# Patient Record
Sex: Female | Born: 1975 | Race: Asian | Hispanic: No | Marital: Married | Smoking: Never smoker
Health system: Southern US, Community
[De-identification: ages and names within clinical notes are randomized; demographics above are authoritative.]

## PROBLEM LIST (undated history)

## (undated) DIAGNOSIS — Z789 Other specified health status: Secondary | ICD-10-CM

## (undated) DIAGNOSIS — O9989 Other specified diseases and conditions complicating pregnancy, childbirth and the puerperium: Secondary | ICD-10-CM

## (undated) DIAGNOSIS — Z283 Underimmunization status: Secondary | ICD-10-CM

## (undated) HISTORY — PX: NO PAST SURGERIES: SHX2092

---

## 2009-02-19 ENCOUNTER — Inpatient Hospital Stay (HOSPITAL_COMMUNITY): Admission: AD | Admit: 2009-02-19 | Discharge: 2009-02-21 | Payer: Self-pay | Admitting: Obstetrics & Gynecology

## 2009-02-24 ENCOUNTER — Encounter: Admission: RE | Admit: 2009-02-24 | Discharge: 2009-03-24 | Payer: Self-pay | Admitting: Obstetrics & Gynecology

## 2009-03-07 ENCOUNTER — Ambulatory Visit: Admission: RE | Admit: 2009-03-07 | Discharge: 2009-03-07 | Payer: Self-pay | Admitting: Obstetrics & Gynecology

## 2011-02-10 LAB — CBC
HCT: 39.1 % (ref 36.0–46.0)
MCHC: 35.1 g/dL (ref 30.0–36.0)
MCHC: 35.8 g/dL (ref 30.0–36.0)
MCV: 106.9 fL — ABNORMAL HIGH (ref 78.0–100.0)
MCV: 107.6 fL — ABNORMAL HIGH (ref 78.0–100.0)
Platelets: 130 10*3/uL — ABNORMAL LOW (ref 150–400)
Platelets: 150 10*3/uL (ref 150–400)
RDW: 13.6 % (ref 11.5–15.5)
RDW: 13.8 % (ref 11.5–15.5)

## 2011-11-02 NOTE — L&D Delivery Note (Addendum)
Delivery Note  Called for precipitous delivery and arrived as head crowning. At 6:11 PM a viable female was delivered OA -> ROA. Cord clamped and cut, cord blood obtained; Dr. Seymour Bars arrived and assumed care at 6:12 pm. POE,DEIRDRE 09/26/2012, 6:16 PM   APGAR: 9,9 ; weight pending.   Placenta status: complete, 3 vs.  Cord:  With no complications: .  Cord pH:  Not done  Anesthesia: none   Episiotomy: none Lacerations: 1st degree perineal and left periurethral.  (Left ant. Labia separated from previous delivery) Suture Repair:  Vycril rapide 3-0. Est. Blood Loss (mL): 300  Mom to postpartum.  Baby to nursery-stable.

## 2012-02-25 LAB — OB RESULTS CONSOLE ABO/RH: RH Type: POSITIVE

## 2012-02-25 LAB — OB RESULTS CONSOLE ANTIBODY SCREEN: Antibody Screen: NEGATIVE

## 2012-03-06 LAB — OB RESULTS CONSOLE GC/CHLAMYDIA
Chlamydia: NEGATIVE
Gonorrhea: NEGATIVE

## 2012-09-26 ENCOUNTER — Encounter (HOSPITAL_COMMUNITY): Payer: Self-pay | Admitting: *Deleted

## 2012-09-26 ENCOUNTER — Inpatient Hospital Stay (HOSPITAL_COMMUNITY)
Admission: AD | Admit: 2012-09-26 | Discharge: 2012-09-28 | DRG: 373 | Disposition: A | Discharge status: Home / Self Care | Payer: BC Managed Care – PPO | Source: Ambulatory Visit | Attending: Obstetrics & Gynecology | Admitting: Obstetrics & Gynecology

## 2012-09-26 DIAGNOSIS — Z283 Underimmunization status: Secondary | ICD-10-CM

## 2012-09-26 DIAGNOSIS — O09519 Supervision of elderly primigravida, unspecified trimester: Secondary | ICD-10-CM | POA: Diagnosis present

## 2012-09-26 DIAGNOSIS — Z2839 Other underimmunization status: Secondary | ICD-10-CM

## 2012-09-26 HISTORY — DX: Other specified health status: Z78.9

## 2012-09-26 HISTORY — DX: Other specified diseases and conditions complicating pregnancy, childbirth and the puerperium: O99.89

## 2012-09-26 HISTORY — DX: Underimmunization status: Z28.3

## 2012-09-26 LAB — CBC
Platelets: 149 10*3/uL — ABNORMAL LOW (ref 150–400)
RBC: 3.85 MIL/uL — ABNORMAL LOW (ref 3.87–5.11)
RDW: 13.4 % (ref 11.5–15.5)
WBC: 14.4 10*3/uL — ABNORMAL HIGH (ref 4.0–10.5)

## 2012-09-26 MED ORDER — OXYTOCIN 10 UNIT/ML IJ SOLN
INTRAMUSCULAR | Status: AC
  Administered 2012-09-26: 10 [IU] via INTRAMUSCULAR
  Filled 2012-09-26: qty 2

## 2012-09-26 MED ORDER — LIDOCAINE HCL (PF) 1 % IJ SOLN
INTRAMUSCULAR | Status: AC
  Administered 2012-09-26: 30 mL via SUBCUTANEOUS
  Filled 2012-09-26: qty 30

## 2012-09-26 MED ORDER — FLEET ENEMA 7-19 GM/118ML RE ENEM: 1.0000 | ENEMA | RECTAL | Status: DC | PRN

## 2012-09-26 MED ORDER — SENNOSIDES-DOCUSATE SODIUM 8.6-50 MG PO TABS
2.0000 | ORAL_TABLET | Freq: Every day | ORAL | Status: DC
  Administered 2012-09-27: 2 via ORAL

## 2012-09-26 MED ORDER — OXYTOCIN 40 UNITS IN LACTATED RINGERS INFUSION - SIMPLE MED: 62.5000 mL/h | INTRAVENOUS | Status: DC

## 2012-09-26 MED ORDER — OXYTOCIN BOLUS FROM INFUSION: 500.0000 mL | INTRAVENOUS | Status: DC

## 2012-09-26 MED ORDER — DIBUCAINE 1 % RE OINT: 1.0000 "application " | TOPICAL_OINTMENT | RECTAL | Status: DC | PRN

## 2012-09-26 MED ORDER — SIMETHICONE 80 MG PO CHEW: 80.0000 mg | CHEWABLE_TABLET | ORAL | Status: DC | PRN

## 2012-09-26 MED ORDER — IBUPROFEN 600 MG PO TABS
600.0000 mg | ORAL_TABLET | Freq: Four times a day (QID) | ORAL | Status: DC | PRN
  Administered 2012-09-26: 600 mg via ORAL
  Filled 2012-09-26: qty 1

## 2012-09-26 MED ORDER — ONDANSETRON HCL 4 MG/2ML IJ SOLN: 4.0000 mg | INTRAMUSCULAR | Status: DC | PRN

## 2012-09-26 MED ORDER — LACTATED RINGERS IV SOLN: INTRAVENOUS | Status: DC

## 2012-09-26 MED ORDER — BENZOCAINE-MENTHOL 20-0.5 % EX AERO: 1.0000 "application " | INHALATION_SPRAY | CUTANEOUS | Status: DC | PRN

## 2012-09-26 MED ORDER — LANOLIN HYDROUS EX OINT: TOPICAL_OINTMENT | CUTANEOUS | Status: DC | PRN

## 2012-09-26 MED ORDER — WITCH HAZEL-GLYCERIN EX PADS: 1.0000 "application " | MEDICATED_PAD | CUTANEOUS | Status: DC | PRN

## 2012-09-26 MED ORDER — DIPHENHYDRAMINE HCL 25 MG PO CAPS: 25.0000 mg | ORAL_CAPSULE | Freq: Four times a day (QID) | ORAL | Status: DC | PRN

## 2012-09-26 MED ORDER — TETANUS-DIPHTH-ACELL PERTUSSIS 5-2.5-18.5 LF-MCG/0.5 IM SUSP: 0.5000 mL | Freq: Once | INTRAMUSCULAR | Status: DC

## 2012-09-26 MED ORDER — CITRIC ACID-SODIUM CITRATE 334-500 MG/5ML PO SOLN: 30.0000 mL | ORAL | Status: DC | PRN

## 2012-09-26 MED ORDER — ONDANSETRON HCL 4 MG/2ML IJ SOLN: 4.0000 mg | Freq: Four times a day (QID) | INTRAMUSCULAR | Status: DC | PRN

## 2012-09-26 MED ORDER — ZOLPIDEM TARTRATE 5 MG PO TABS: 5.0000 mg | ORAL_TABLET | Freq: Every evening | ORAL | Status: DC | PRN

## 2012-09-26 MED ORDER — ACETAMINOPHEN 325 MG PO TABS: 650.0000 mg | ORAL_TABLET | ORAL | Status: DC | PRN

## 2012-09-26 MED ORDER — ONDANSETRON HCL 4 MG PO TABS: 4.0000 mg | ORAL_TABLET | ORAL | Status: DC | PRN

## 2012-09-26 MED ORDER — OXYCODONE-ACETAMINOPHEN 5-325 MG PO TABS
1.0000 | ORAL_TABLET | ORAL | Status: DC | PRN
  Administered 2012-09-26 – 2012-09-27 (×3): 1 via ORAL
  Filled 2012-09-26 (×3): qty 1

## 2012-09-26 MED ORDER — IBUPROFEN 600 MG PO TABS
600.0000 mg | ORAL_TABLET | Freq: Four times a day (QID) | ORAL | Status: DC
  Administered 2012-09-27 – 2012-09-28 (×6): 600 mg via ORAL
  Filled 2012-09-26 (×7): qty 1

## 2012-09-26 MED ORDER — PRENATAL MULTIVITAMIN CH
1.0000 | ORAL_TABLET | Freq: Every day | ORAL | Status: DC
  Administered 2012-09-27 – 2012-09-28 (×2): 1 via ORAL
  Filled 2012-09-26 (×2): qty 1

## 2012-09-26 MED ORDER — OXYCODONE-ACETAMINOPHEN 5-325 MG PO TABS
1.0000 | ORAL_TABLET | ORAL | Status: DC | PRN
Start: 2012-09-26 — End: 2012-09-26

## 2012-09-26 MED ORDER — LIDOCAINE HCL (PF) 1 % IJ SOLN
30.0000 mL | INTRAMUSCULAR | Status: DC | PRN
  Administered 2012-09-26: 30 mL via SUBCUTANEOUS

## 2012-09-26 MED ORDER — LACTATED RINGERS IV SOLN: 500.0000 mL | INTRAVENOUS | Status: DC | PRN

## 2012-09-26 NOTE — MAU Note (Signed)
Contractions since last night. Stronger this afternoon. Leaked some water last night.

## 2012-09-26 NOTE — Progress Notes (Signed)
Report called to Synetta Fail RN in [facility] by [name] RNC. Pt to 169 via stretcher

## 2012-09-26 NOTE — MAU Note (Signed)
[name] RNC notified Dr Seymour Bars of pt's admission and status. MD requested to come in for delivery and aware pt transported to [facility]

## 2012-09-26 NOTE — H&P (Signed)
Brandy Whitney is a 36 y.o. female G1P1001 [redacted]w[redacted]d presenting for advanced labor.  RA:  Arrived at MAU at full dilation.  OB History    Grav Para Term Preterm Abortions TAB SAB Ect Mult Living   1 1 1  0 0 0 0 0 0 1     Past Medical History  Diagnosis Date  . No pertinent past medical history    Past Surgical History  Procedure Date  . No past surgeries    Family History: family history is negative for Other. Social History:  reports that she has never smoked. She does not have any smokeless tobacco history on file. She reports that she does not drink alcohol or use illicit drugs. Current facility-administered medications:[COMPLETED] oxytocin (PITOCIN) 10 UNIT/ML injection, , , , , 10 Units at 09/26/12 1822;  acetaminophen (TYLENOL) tablet 650 mg, 650 mg, Oral, Q4H PRN, Genia Del, MD;  citric acid-sodium citrate (ORACIT) solution 30 mL, 30 mL, Oral, Q2H PRN, Genia Del, MD;  ibuprofen (ADVIL,MOTRIN) tablet 600 mg, 600 mg, Oral, Q6H PRN, Genia Del, MD lactated ringers infusion 500-1,000 mL, 500-1,000 mL, Intravenous, PRN, Genia Del, MD;  lactated ringers infusion, , Intravenous, Continuous, Genia Del, MD;  lidocaine (XYLOCAINE) 1 % injection 30 mL, 30 mL, Subcutaneous, PRN, Genia Del, MD, 30 mL at 09/26/12 1823;  ondansetron (ZOFRAN) injection 4 mg, 4 mg, Intravenous, Q6H PRN, Genia Del, MD oxyCODONE-acetaminophen (PERCOCET/ROXICET) 5-325 MG per tablet 1-2 tablet, 1-2 tablet, Oral, Q3H PRN, Genia Del, MD;  oxytocin (PITOCIN) IV BOLUS FROM BAG, 500 mL, Intravenous, Continuous, Genia Del, MD;  oxytocin (PITOCIN) IV infusion 40 units in LR 1000 mL, 62.5 mL/hr, Intravenous, Continuous, Genia Del, MD;  sodium phosphate (FLEET) 7-19 GM/118ML enema 1 enema, 1 enema, Rectal, PRN, Genia Del, MD Allergies not on file  Dilation: 10 Station: -1 Exam by:: Quintella Baton RNC BOWB Blood pressure 76/56, pulse 99, temperature 97.6 F  (36.4 C), temperature source Oral, resp. rate 22, unknown if currently breastfeeding.  HPP: There is no problem list on file for this patient.   Prenatal labs: ABO, Rh: O/Positive/-- (04/26 0000) Antibody: Negative (04/26 0000) Rubella:   RPR: Nonreactive (04/26 0000)  HBsAg: Negative (04/26 0000)  HIV: Non-reactive (04/26 0000)  Genetic testing: wnl [place] anato: wnl 1 hr GTT: wnl GBS: Negative (11/01 0000)   Assessment/Plan: Advanced labor, expeditive delivery.  Fetal well-being reassuring.   Brandy Whitney,MARIE-LYNE 09/26/2012, 6:42 PM

## 2012-09-27 LAB — CBC
HCT: 35.7 % — ABNORMAL LOW (ref 36.0–46.0)
MCH: 36 pg — ABNORMAL HIGH (ref 26.0–34.0)
MCV: 102 fL — ABNORMAL HIGH (ref 78.0–100.0)
Platelets: 129 10*3/uL — ABNORMAL LOW (ref 150–400)
RBC: 3.5 MIL/uL — ABNORMAL LOW (ref 3.87–5.11)
RDW: 13.3 % (ref 11.5–15.5)

## 2012-09-27 LAB — RPR: RPR Ser Ql: NONREACTIVE

## 2012-09-27 NOTE — Progress Notes (Signed)
Patient ID: Brandy Whitney, female   DOB: [birth year], 36 y.o.   MRN: [id number] PPD # 1  Subjective: Pt reports feeling well, tired, mild soreness/ Pain controlled with ibuprofen Tolerating po/ Voiding without problems/ No n/v Bleeding is moderate Newborn info:  Information for the patient's newborn:  Brandy, Whitney [[id number]]  female  Feeding: breast   Objective:  VS: Blood pressure 98/63, pulse 65, temperature 97.8 F (36.6 C), temperature source Oral, resp. rate 18.    Basename 09/27/12 0535 09/26/12 1903  WBC 12.4* 14.4*  HGB 12.6 14.0  HCT 35.7* 39.0  PLT 129* 149*    Blood type: --/--/O POS, O POS (11/26 1903) Rubella: Nonimmune (04/26 0000)    Physical Exam:  General: A & O x 3  alert, cooperative and no distress CV: Regular rate and rhythm Resp: clear Abdomen: soft, nontender, normal bowel sounds Uterine Fundus: firm, below umbilicus, nontender Perineum: minor 1st degree repair noted, and healing well; small amt edema Lochia: moderate Ext: edema trace   A/P: PPD # 1/ G1P1001/ S/P:spontaneous vaginal delivery with 1st degree repair Doing well Continue routine post partum orders Anticipate D/C home in AM    Demetrius Revel, MSN, [facility] 09/27/2012, 8:51 AM

## 2012-09-28 ENCOUNTER — Encounter (HOSPITAL_COMMUNITY): Payer: Self-pay

## 2012-09-28 DIAGNOSIS — Z2839 Other underimmunization status: Secondary | ICD-10-CM

## 2012-09-28 DIAGNOSIS — Z283 Underimmunization status: Secondary | ICD-10-CM

## 2012-09-28 DIAGNOSIS — O99892 Other specified diseases and conditions complicating childbirth: Secondary | ICD-10-CM

## 2012-09-28 HISTORY — DX: Other specified diseases and conditions complicating childbirth: O99.892

## 2012-09-28 HISTORY — DX: Other underimmunization status: Z28.39

## 2012-09-28 MED ORDER — IBUPROFEN 600 MG PO TABS: 600.0000 mg | ORAL_TABLET | Freq: Four times a day (QID) | ORAL | Status: AC

## 2012-09-28 MED ORDER — PRENATAL MULTIVITAMIN CH: 1.0000 | ORAL_TABLET | Freq: Every day | ORAL | Status: AC

## 2012-09-28 MED ORDER — MEASLES, MUMPS & RUBELLA VAC ~~LOC~~ INJ
0.5000 mL | INJECTION | Freq: Once | SUBCUTANEOUS | Status: AC
  Administered 2012-09-28: 0.5 mL via SUBCUTANEOUS
  Filled 2012-09-28: qty 0.5

## 2012-09-28 NOTE — Progress Notes (Addendum)
Post Partum Day #2            Information for the patient's newborn:  Adriauna, Campton [[id number]]  female   Feeding: breast  Subjective: No HA, SOB, CP, F/C, breast symptoms. Pain minimal. Normal vaginal bleeding, no clots.      Objective:  Temp:  [98.1 F (36.7 C)-98.6 F (37 C)] 98.1 F (36.7 C) (11/28 0559) Pulse Rate:  [60-72] 60  (11/28 0559) Resp:  [16-18] 18  (11/28 0559) BP: (88-115)/(49-71) 99/64 mmHg (11/28 0559)  No intake or output data in the 24 hours ending 09/28/12 0821     Basename 09/27/12 0535 09/26/12 1903  WBC 12.4* 14.4*  HGB 12.6 14.0  HCT 35.7* 39.0  PLT 129* 149*    Blood type: --/--/O POS, O POS (11/26 1903) Rubella: Nonimmune (04/26 0000)    Physical Exam:  General: alert, cooperative and no distress Uterine Fundus: firm Lochia: appropriate Perineum: repair intact, edema none DVT Evaluation: Negative Homan's sign. No significant calf/ankle edema.    Assessment/Plan: PPD # 2 / 36 y.o., [id number] S/P:spontaneous vaginal   Active Problems:  NSVD (normal spontaneous vaginal delivery 11/26)  Postpartum care following vaginal delivery  First-degree perineal laceration, with delivery  Rubella nonimmune status, delivered, current hospitalization    normal postpartum exam  Continue current postpartum care  MMR vaccine prior to DC  D/C home   LOS: 2 days   PAUL,DANIELA, CNM, MSN 09/28/2012, 8:21 AM

## 2012-09-28 NOTE — Discharge Summary (Signed)
Obstetric Discharge Summary Reason for Admission: onset of labor Prenatal Procedures: ultrasound Intrapartum Procedures: spontaneous vaginal delivery and precipitous labor Postpartum Procedures: Rubella Ig Complications-Operative and Postpartum: 1st degree perineal laceration Hemoglobin  Date Value Range Status  09/27/2012 12.6  12.0 - 15.0 g/dL Final     HCT  Date Value Range Status  09/27/2012 35.7* 36.0 - 46.0 % Final    Physical Exam:  General: alert, cooperative and no distress Lochia: appropriate Uterine Fundus: firm Incision: healing well DVT Evaluation: Negative Homan's sign. No significant calf/ankle edema.  Discharge Diagnoses: Term Pregnancy-delivered  Discharge Information: Date: 09/28/2012 Activity: pelvic rest Diet: routine Medications: PNV and Ibuprofen Condition: stable Instructions: refer to practice specific booklet Discharge to: home Follow-up Information    Follow up with LAVOIE,MARIE-LYNE, MD. In 6 weeks.   Contact information:   Brandy Whitney [id number] [phone]          Newborn Data: Live born female  Birth Weight: 6 lb 4 oz (2835 g) APGAR: 9, 9  Home with mother.  Brandy Whitney 09/28/2012, 8:39 AM

## 2014-07-11 ENCOUNTER — Other Ambulatory Visit (HOSPITAL_COMMUNITY): Payer: Self-pay | Admitting: Family Medicine

## 2014-07-11 ENCOUNTER — Other Ambulatory Visit: Payer: Self-pay | Admitting: Family Medicine

## 2014-07-11 DIAGNOSIS — R1110 Vomiting, unspecified: Secondary | ICD-10-CM

## 2014-07-11 DIAGNOSIS — R634 Abnormal weight loss: Secondary | ICD-10-CM

## 2014-07-15 ENCOUNTER — Ambulatory Visit (HOSPITAL_COMMUNITY)
Admission: RE | Admit: 2014-07-15 | Discharge: 2014-07-15 | Disposition: A | Discharge status: Home / Self Care | Payer: BC Managed Care – PPO | Source: Ambulatory Visit | Attending: Family Medicine | Admitting: Family Medicine

## 2014-07-15 DIAGNOSIS — R112 Nausea with vomiting, unspecified: Secondary | ICD-10-CM | POA: Diagnosis not present

## 2014-07-15 DIAGNOSIS — R1110 Vomiting, unspecified: Secondary | ICD-10-CM

## 2014-07-15 DIAGNOSIS — R634 Abnormal weight loss: Secondary | ICD-10-CM

## 2014-07-15 DIAGNOSIS — R17 Unspecified jaundice: Secondary | ICD-10-CM | POA: Insufficient documentation

## 2014-09-02 ENCOUNTER — Encounter (HOSPITAL_COMMUNITY): Payer: Self-pay

## 2014-10-18 ENCOUNTER — Emergency Department (HOSPITAL_COMMUNITY)
Admission: EM | Admit: 2014-10-18 | Discharge: 2014-10-18 | Disposition: A | Discharge status: Home / Self Care | Payer: BC Managed Care – PPO | Attending: Emergency Medicine | Admitting: Emergency Medicine

## 2014-10-18 ENCOUNTER — Encounter (HOSPITAL_COMMUNITY): Payer: Self-pay | Admitting: Emergency Medicine

## 2014-10-18 DIAGNOSIS — R202 Paresthesia of skin: Secondary | ICD-10-CM | POA: Diagnosis not present

## 2014-10-18 DIAGNOSIS — R200 Anesthesia of skin: Secondary | ICD-10-CM | POA: Diagnosis not present

## 2014-10-18 DIAGNOSIS — Z79899 Other long term (current) drug therapy: Secondary | ICD-10-CM | POA: Diagnosis not present

## 2014-10-18 LAB — I-STAT CHEM 8, ED
BUN: 12 mg/dL (ref 6–23)
Calcium, Ion: 1.19 mmol/L (ref 1.12–1.23)
Chloride: 101 mEq/L (ref 96–112)
Creatinine, Ser: 0.5 mg/dL (ref 0.50–1.10)
Glucose, Bld: 129 mg/dL — ABNORMAL HIGH (ref 70–99)
HCT: 43 % (ref 36.0–46.0)
HEMOGLOBIN: 14.6 g/dL (ref 12.0–15.0)
POTASSIUM: 3.9 meq/L (ref 3.7–5.3)
SODIUM: 139 meq/L (ref 137–147)
TCO2: 25 mmol/L (ref 0–100)

## 2014-10-18 NOTE — ED Notes (Signed)
Pt sts right sided numbness and tingling x 3 months; pt sts some fatigue; pt moves extremity well with no weakness noted

## 2014-10-18 NOTE — ED Provider Notes (Signed)
CSN: [id number]     Arrival date & time 10/18/14  1032 History   First MD Initiated Contact with Patient 10/18/14 1123     Chief Complaint  Patient presents with  . Numbness     HPI Patient presents to the emergency department complaining of intermittent right sided numbness over the past 3 months.  She states it feels numb and tingling.  This seems to only occur at nighttime.  She states she feels no tingling or numbness during the day during her normal activities.  She denies nausea and vomiting.  No diarrhea.  She denies weakness or arms or legs.  No difficulty with her speech.  She has a 488-year-old daughter at home with her.  Her spouse works as a Runner, broadcasting/film/videoteacher.  She denies headaches.  No changes in her vision.  Symptoms are intermittent.  She has not seen anyone about this.   Past Medical History  Diagnosis Date  . No pertinent past medical history   . NSVD (normal spontaneous vaginal delivery 11/26) 09/28/2012  . Postpartum care following vaginal delivery 09/28/2012  . First-degree perineal laceration, with delivery 09/28/2012  . Rubella nonimmune status, delivered, current hospitalization 09/28/2012   Past Surgical History  Procedure Laterality Date  . No past surgeries     Family History  Problem Relation Age of Onset  . Other Neg Hx    History  Substance Use Topics  . Smoking status: Never Smoker   . Smokeless tobacco: Not on file  . Alcohol Use: No   OB History    Gravida Para Term Preterm AB TAB SAB Ectopic Multiple Living   1 1 1  0 0 0 0 0 0 1     Review of Systems  All other systems reviewed and are negative.     Allergies  Review of patient's allergies indicates no known allergies.  Home Medications   Prior to Admission medications   Medication Sig Start Date End Date Taking? Authorizing Provider  ibuprofen (ADVIL,MOTRIN) 600 MG tablet Take 1 tablet (600 mg total) by mouth every 6 (six) hours. 09/28/12   Arlan Organaniela Paul, CNM  Prenatal Vit-Fe Fumarate-FA  (PRENATAL MULTIVITAMIN) TABS Take 1 tablet by mouth daily. 09/28/12   Arlan Organaniela Paul, CNM   BP 109/57 mmHg  Pulse 86  Temp(Src) 97.8 F (36.6 C) (Oral)  Resp 18  SpO2 96% Physical Exam  Constitutional: She is oriented to person, place, and time. She appears well-developed and well-nourished. No distress.  HENT:  Head: Normocephalic and atraumatic.  Eyes: EOM are normal. Pupils are equal, round, and reactive to light.  Neck: Normal range of motion.  Cardiovascular: Normal rate, regular rhythm and normal heart sounds.   Pulmonary/Chest: Effort normal and breath sounds normal.  Abdominal: Soft. She exhibits no distension. There is no tenderness.  Musculoskeletal: Normal range of motion.  Neurological: She is alert and oriented to person, place, and time.  5/5 strength in major muscle groups of  bilateral upper and lower extremities. Speech normal. No facial asymetry.   Skin: Skin is warm and dry.  Psychiatric: She has a normal mood and affect. Judgment normal.  Nursing note and vitals reviewed.   ED Course  Procedures (including critical care time) Labs Review Labs Reviewed  I-STAT CHEM 8, ED - Abnormal; Notable for the following:    Glucose, Bld 129 (*)    All other components within normal limits    Imaging Review No results found.   EKG Interpretation None  MDM   Final diagnoses:  Numbness    Intermittent paresthesias 3 months.  I have asked that she follow-up with the primary care physician.  I do not think this is a stroke.  I do not think she needs imaging today.  Basic electrolytes including potassium are normal.  Hemoglobin is normal.  Discharge home in good condition    Lyanne CoKevin M Jonet Mathies, MD 10/18/14 1209

## 2014-10-18 NOTE — ED Notes (Signed)
Husband expressed he was ready to leave, escorted husband, wife, and child to waiting room

## 2014-10-18 NOTE — Discharge Planning (Signed)
CARE MANAGEMENT ED NOTE 10/18/2014  Patient:  Brandy Whitney, Brandy Whitney   Account Number:  [id number]  Date Initiated:  10/18/2014  Documentation initiated by:  [facility]  Subjective/Objective Assessment:   presents to the emergency department complaining of intermittent right sided numbness over the past 3 months//Home with spouse and 38yo daughter.     Subjective/Objective Assessment Detail:     Action/Plan:   Basic electrolytes including potassium are normal.  Hemoglobin is normal.  Discharge home in good condition//Consult CM for PCP appt    Anticipated DC Date:  10/18/2014     Status Recommendation to Physician:   Result of Recommendation:      [place]  CM consult  PCP issues  Follow-up appt scheduled    Choice offered to / List presented to:            Status of service:  Completed, signed off  ED Comments:  Spoke with patient, pt reports not sleeping well, not bathing, pt appears disheveled and unkept. Pt also has line of bruises on left shin, pt noted to repeatedly strike shin, stating that the bruising is a result of her liver problem.  ED Comments Detail:  ED CM consulted to meet with patient concerning f/u care with PCP and patient does have insurance. Pt presented to [facility] ED today with numbness.  Met with patient at bedside, confirmed informaton. Pt  reports not having access to f/u care with PCP. Discussed with patient importance and benefits of  establishing PCP, and not utilizing the ED for primary care needs. Pt verbalized understanding and is in agreement. Discussed other options, provided list of local affordable PCPs.  Pt voiced  Interest in the [facility] and [place].  [facility] Brochure given with address, phone number, and the services highlighted. NCM schedule appt for establishing care for PCP on 10/28/14 @ 1400. Pt verbalized understanding.

## 2014-10-18 NOTE — ED Notes (Signed)
Charge RN, [place], Dr. Patria Maneampos, and ED Case Manager notified of patient's concerns regarding discharge. Dr. Patria Maneampos has now spoken with patient multiple times, Charge RN at bedside to speak with patient.

## 2014-10-18 NOTE — ED Notes (Signed)
Patient requesting to speak with Dr. Patria Maneampos prior to discharge regarding concerns about d/c.

## 2014-10-18 NOTE — ED Notes (Addendum)
Pt requesting this RN to set up an appointment. [facility] [place], neither are taking new patients at this time. Interpreter line used. Pt concerned for liver CA - reports history of Cirrhosis 10 years ago while living in [place]. Pt is not jaundice, denies abdominal pain/nausea/vomiting.

## 2014-10-18 NOTE — ED Notes (Signed)
Patient moved to Pod C to await Case Chemical engineerManager RN. [facility].

## 2014-10-18 NOTE — ED Notes (Signed)
Spoke with patient, pt reports not sleeping well, not bathing, pt appears disheveled and unkempt. Pt also noted to have line of bruises along left shin, pt noted to repeatedly strike shin, stating that the bruising is a manifestation of her liver problem.  Pt speech somewhat disorganized, although pt noted to have english as a second language and to not be entirely fluent.  Husband has arrived and is at bedside, Consulting civil engineerCharge RN notified, Dr Patria Maneampos notified.

## 2014-10-18 NOTE — ED Notes (Addendum)
Pt comfortable with discharge and follow up instructions. Pt declines wheelchair, escorted to waiting area by this RN. No prescriptions. Pt given resource guide to assist in locating a PCP.

## 2014-10-18 NOTE — ED Notes (Signed)
Reports given to case management.

## 2014-10-18 NOTE — Discharge Instructions (Signed)

## 2014-10-18 NOTE — ED Notes (Signed)
Called and spoke with patient husband Brandy Whitney regarding patient and plan of care for patient.  Informed husband that patient was here with her child and was healthy, but perhaps was misunderstanding our treatment plan for her.  Mr Brandy Whitney expressed understanding, stated he would call wife.  Charge rn [place] notified.

## 2014-10-18 NOTE — Progress Notes (Signed)
Based on concerns re: members of the medical team, CPS report made on behalf of pt's child.

## 2014-10-28 ENCOUNTER — Ambulatory Visit: Payer: BC Managed Care – PPO | Admitting: Family Medicine

## 2015-12-23 IMAGING — US US ABDOMEN COMPLETE
1 series · 14 of 25 positions shown · non-contrast
Comparison: None.

CLINICAL DATA: Nausea, vomiting, jaundice and weight loss

EXAM:
ULTRASOUND ABDOMEN COMPLETE

[Series 1: us abdomen complete · 0.17mm/px · 14 of 94 slices shown]
[im 1/94]
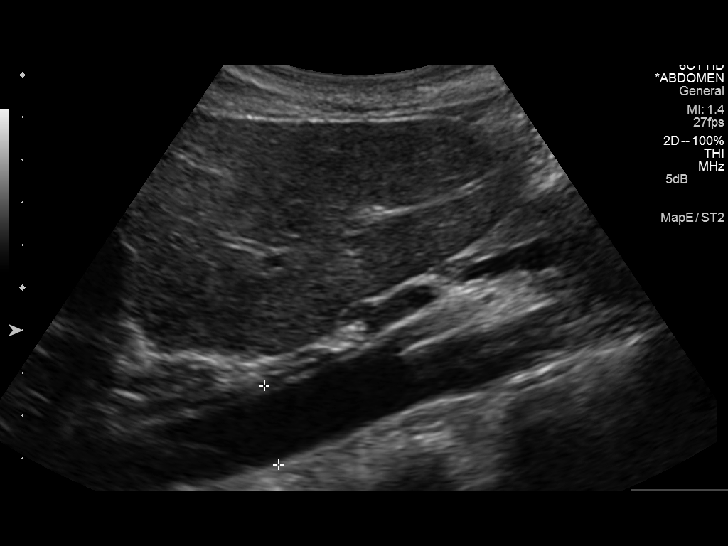
[im 8/94]
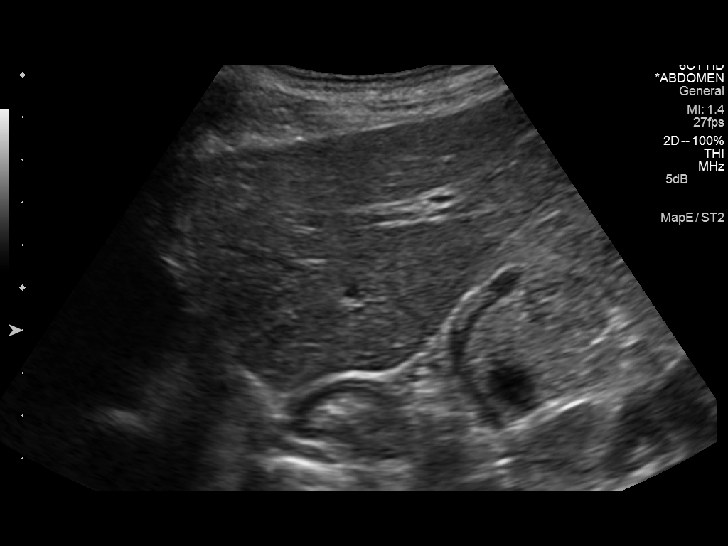
[im 16/94]
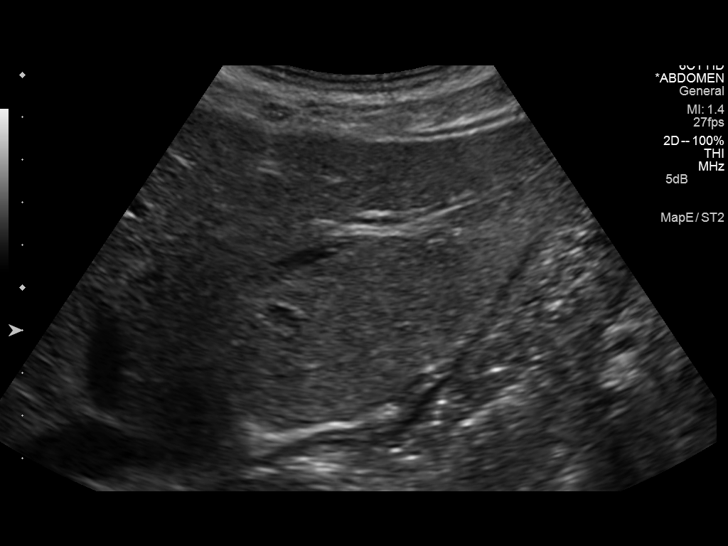
[im 24/94]
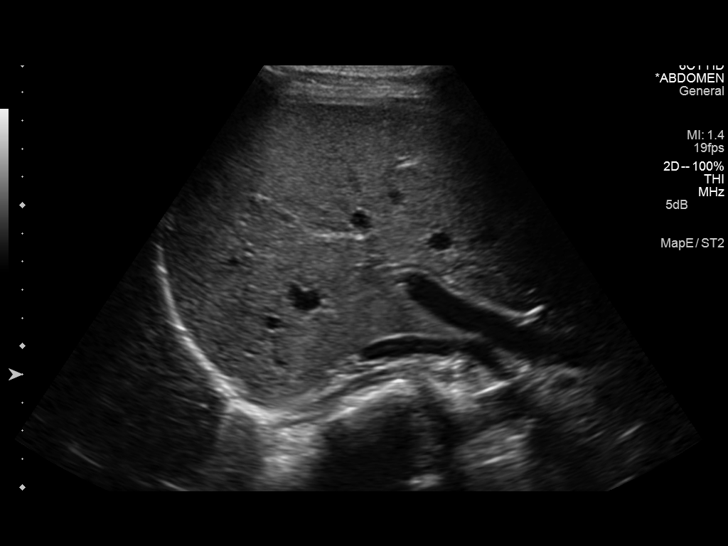
[im 32/94]
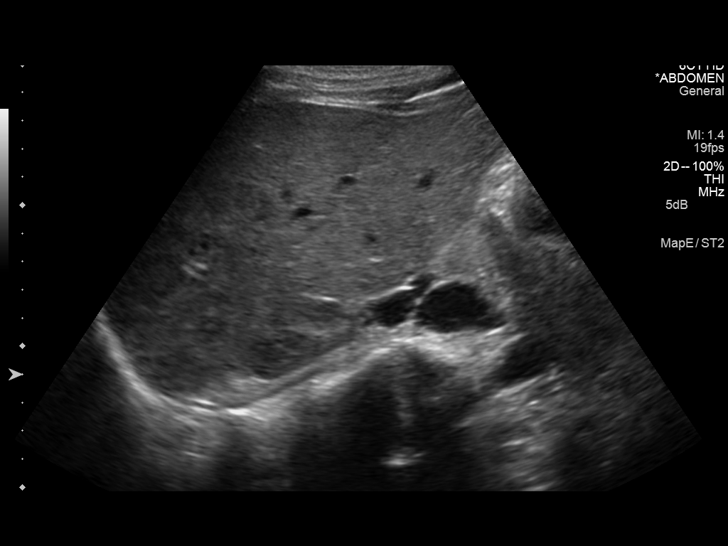
[im 35/94]
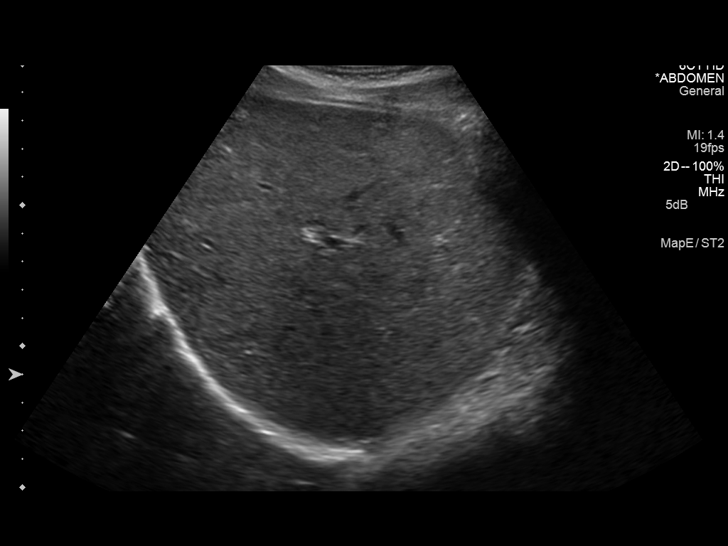
[im 43/94]
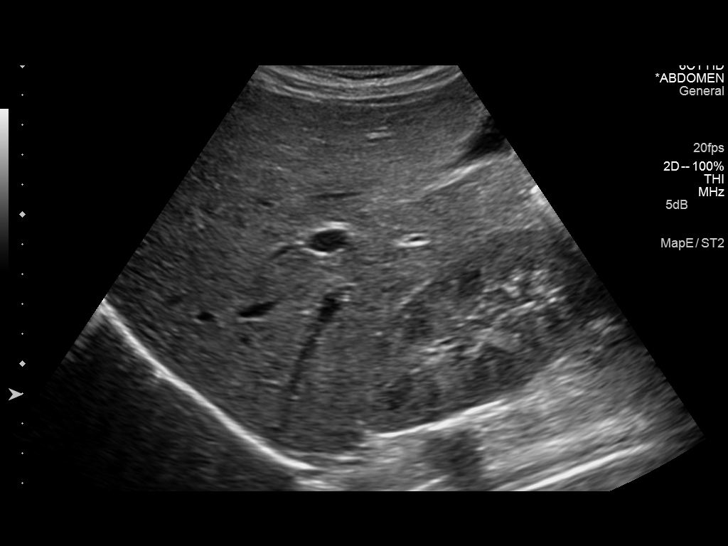
[im 51/94]
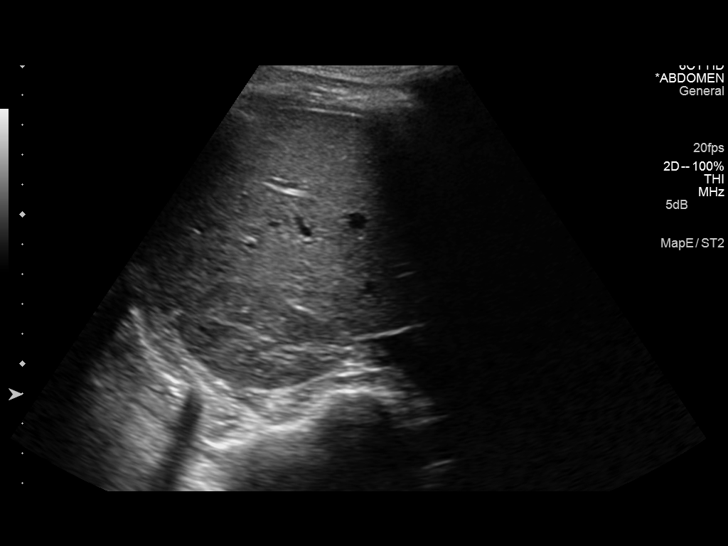
[im 59/94]
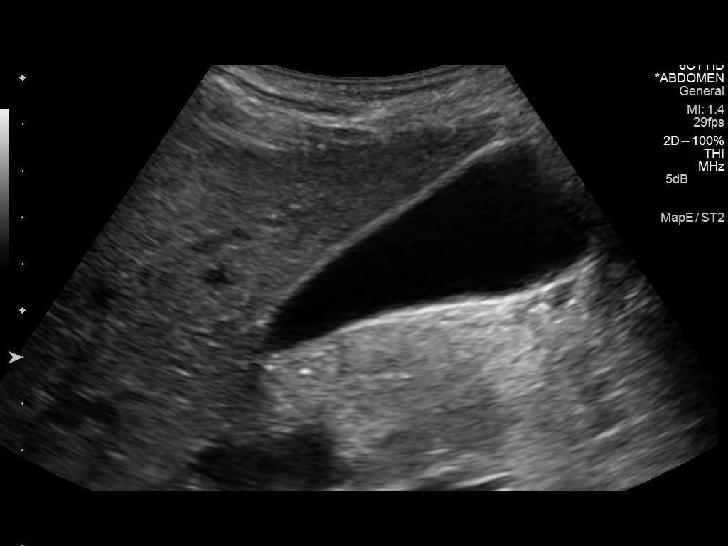
[im 63/94]
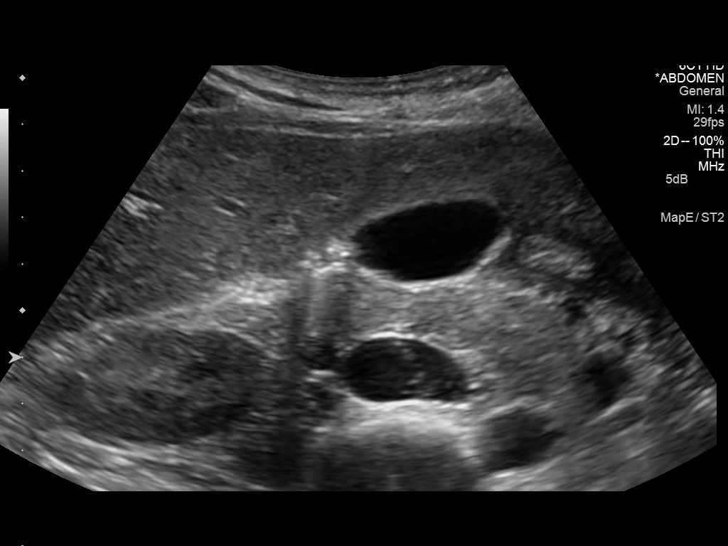
[im 70/94]
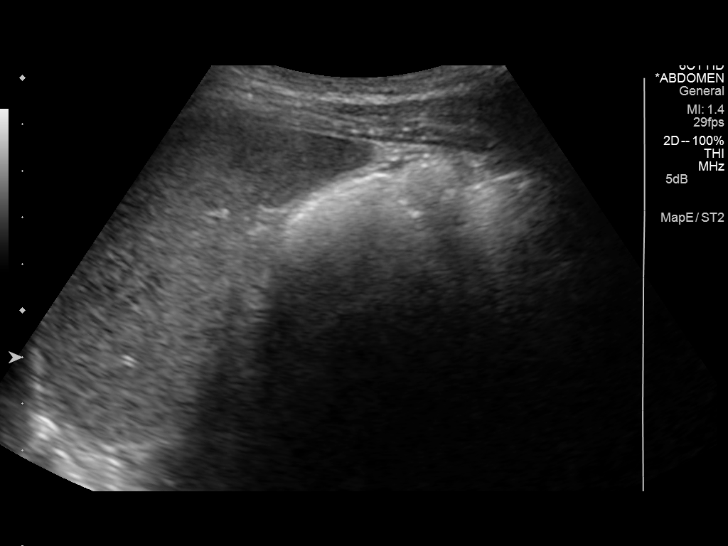
[im 78/94]
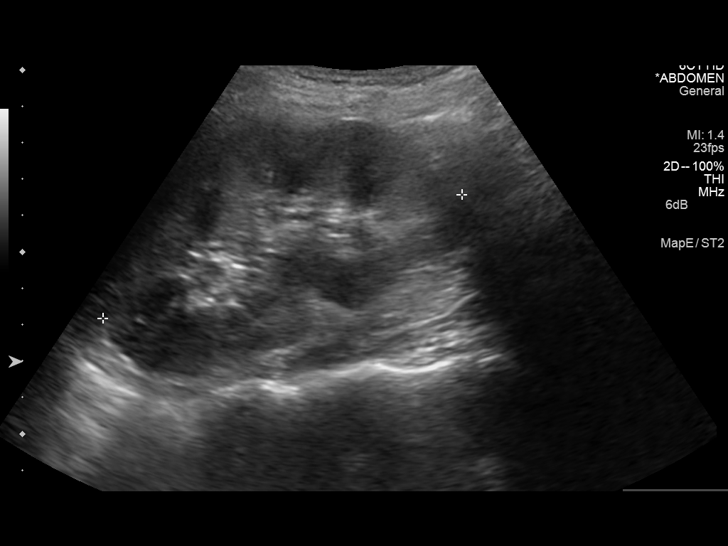
[im 86/94]
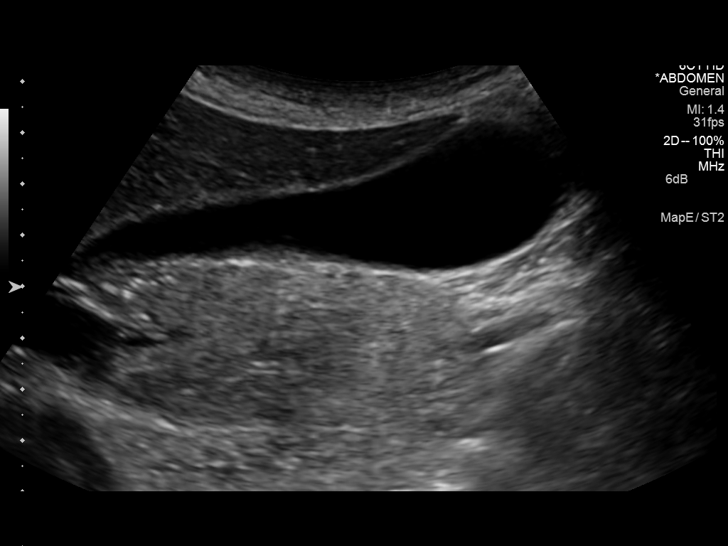
[im 94/94]
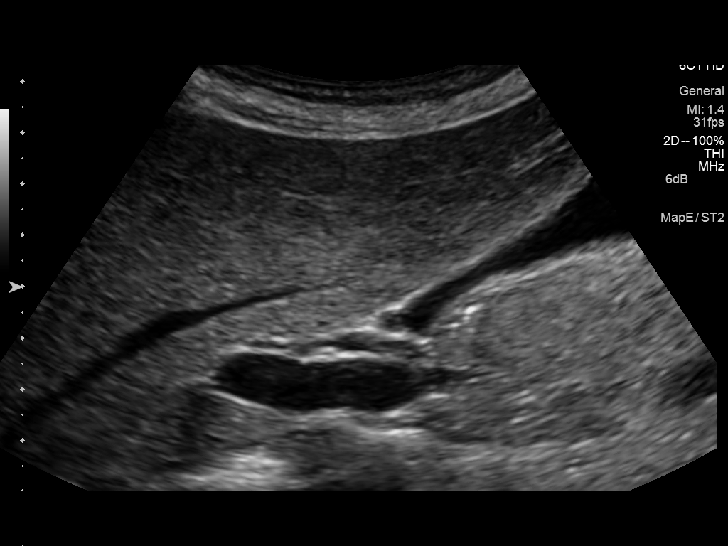

[14 of 25 positions shown; findings below may reference images not displayed]

FINDINGS: Gallbladder:

No gallstones or wall thickening visualized. No sonographic Murphy
sign noted.

Common bile duct:

Diameter: 1.8 mm

Liver:

No focal lesion identified. Within normal limits in parenchymal
echogenicity.

IVC:

No abnormality visualized.

Pancreas:

Visualized portion unremarkable.

Spleen:

Size and appearance within normal limits.

Right Kidney:

Length: 10.8 cm. Echogenicity within normal limits. No mass or
hydronephrosis visualized.

Left Kidney:

Length: 10.4 cm. Echogenicity within normal limits. No mass or
hydronephrosis visualized.

Abdominal aorta:

No aneurysm visualized.

Other findings:

None.
IMPRESSION: No acute abnormality.  No focal abnormality identified.

## 2021-11-22 ENCOUNTER — Ambulatory Visit (HOSPITAL_COMMUNITY)
Admission: EM | Admit: 2021-11-22 | Discharge: 2021-11-22 | Disposition: A | Discharge status: Home / Self Care | Payer: BC Managed Care – PPO

## 2021-11-22 DIAGNOSIS — F330 Major depressive disorder, recurrent, mild: Secondary | ICD-10-CM

## 2021-11-22 DIAGNOSIS — Z638 Other specified problems related to primary support group: Secondary | ICD-10-CM

## 2021-11-22 NOTE — ED Provider Notes (Addendum)
Behavioral Health Urgent Whitney Medical Screening Exam  Patient Name: Brandy Whitney MRN: [id number] Date of Evaluation: 11/22/21 Chief Complaint:  Not compliant with medications Diagnosis:  Final diagnoses:  Mild episode of recurrent major depressive disorder ([place])  Family discord    History of Present illness: Brandy Whitney is a 46 y.o. female.  Presents significantly urgent Whitney accompanied by her husband.  Husband reports concerns that patient's mood has changed.  Stated they recently celebrated the [place] new year and patient began knocking on neighbors door with him for additional family members in [place].   Brandy Whitney reported " do not want to take medications anymore, I been taking them for a while I do not know what they are doing to my body."  Patient's husband reports patient has been off her quetiapine for the past 2 to 3 weeks.    She stated " I feel like a prisoner in my home, I can't do anything, he cut off my credit cards I want a divorce."  She denied suicidal or homicidal ideations.  Denies auditory visual hallucinations.  Patient's husband reports patient has a follow-up appointment with primary Whitney psychiatrist Brandy Whitney.  Discussed considering long-acting injectable at follow-up appointment.  Case staffed with attending psychiatrist Brandy Whitney.  Support, encouragement and reassurance was provided.  Brandy Whitney, 46 y.o., female patient seen face to face by this provider, consulted with Dr. Lovette Whitney; and chart reviewed on 11/22/21.  On evaluation Brandy Whitney reports   During evaluation Brandy Whitney is sitting  in no acute distress. She is alert/oriented x 4; calm/cooperative; slightly disheveled and mood congruent with affect. She is speaking in a clear tone at moderate volume, and normal pace; with good eye contact.  Her thought process is coherent and relevant; There is no indication that she is currently responding to internal/external stimuli or experiencing delusional thought content; and  she has denied suicidal/self-harm/homicidal ideation, psychosis, and paranoia.   Patient has remained calm throughout assessment and has answered questions appropriately.     At this time Brandy Whitney is educated and verbalizes understanding of mental health resources and other crisis services in the community.  [name] let us she is instructed to call 911 and present to the nearest emergency room should about she experience any suicidal/homicidal ideation, auditory/visual/hallucinations, or detrimental worsening of her mental health condition.   She was a also advised by Probation officer that she could call the toll-free phone on insurance card to assist with identifying in network counselors and agencies or number on back of Medicaid card to speak with Whitney coordinator    Psychiatric Specialty Exam  Presentation  General Appearance:Appropriate for Environment  Eye Contact:Good  Speech:Clear and Coherent  Speech Volume:Normal  Handedness:Right   Mood and Affect  Mood:Anxious; Depressed  Affect:Congruent   Thought Process  Thought Processes:Coherent  Descriptions of Associations:Intact  Orientation:Full (Time, Place and Person)  Thought Content:Logical    Hallucinations:None  Ideas of Reference:None  Suicidal Thoughts:No  Homicidal Thoughts:No   Sensorium  Memory:Immediate Good; Recent Good; Remote Good  Judgment:Good  Insight:Fair   Executive Functions  Concentration:Good  Attention Span:Good  [place]   Psychomotor Activity  Psychomotor Activity:Normal   Assets  Assets:Desire for Improvement; Armed forces logistics/support/administrative officer; Social Support   Sleep  Sleep:Poor  Number of hours: No data recorded  Nutritional Assessment (For OBS and FBC admissions only) Has the patient had a weight loss or gain of 10 pounds or more in the last 3 months?: No Has the  patient had a decrease in food intake/or appetite?: No Does the patient  have dental problems?: No Does the patient have eating habits or behaviors that may be indicators of an eating disorder including binging or inducing vomiting?: No Has the patient recently lost weight without trying?: 0 Has the patient been eating poorly because of a decreased appetite?: 0 Malnutrition Screening Tool Score: 0    Physical Exam: Physical Exam Vitals and nursing note reviewed.  Cardiovascular:     Rate and Rhythm: Normal rate and regular rhythm.  Neurological:     General: No focal deficit present.     Mental Status: She is oriented to person, place, and time.  Psychiatric:        Attention and Perception: Attention and perception normal.        Mood and Affect: Affect normal. Mood is anxious.        Speech: Speech normal.        Behavior: Behavior normal. Behavior is cooperative.        Thought Content: Thought content normal.        Cognition and Memory: Cognition normal.        Judgment: Judgment normal.   Review of Systems  Constitutional: Negative.   HENT: Negative.    Genitourinary: Negative.   Skin: Negative.   Psychiatric/Behavioral:  Positive for hallucinations. Negative for depression and suicidal ideas. The patient is nervous/anxious.   All other systems reviewed and are negative. unknown if currently breastfeeding. There is no height or weight on file to calculate BMI.  Musculoskeletal: Strength & Muscle Tone: within normal limits Gait & Station: normal Patient leans: N/A   [place] MSE Discharge Disposition for Follow up and Recommendations: Based on my evaluation the patient does not appear to have an emergency medical condition and can be discharged with resources and follow up Whitney in outpatient services for Medication Management and Consider LAI at follow-up with Brandy Whitney.    [place]andy [place], NP 11/22/2021, 12:02 PM

## 2021-11-22 NOTE — Progress Notes (Signed)
°   11/22/21 1140  [facility] Triage Screening (Walk-ins at [facility] only)  How Did You Hear About [place]? Family/Friend  What Is the Reason for Your Visit/Call Today? 46 year old female present to [facility] with her husband. He expresses concern with his wife mood. Report she has a delusional disorder diagnosis. Last night report she left the home at 4am knocked on neighbors doors and was brought home by the police. Patient sees Dr. Domingo Mend for psychiatric services. She expressed she tired of taking medication and does not want to take medication anymore. Patient report she wants to go back home '[place]'. Patient denied SI/HI/ and AVH. [place]  How Long Has This Been Causing You Problems? <Week (Husband report wife behavior changed 2 or 3 days ago)  Have You Recently Had Any Thoughts About Hurting Yourself? No  Are You Planning to Commit Suicide/Harm Yourself At This time? No  Have you Recently Had Thoughts About Hurting Someone Karolee Ohs? No  Are You Planning To Harm Someone At This Time? No  Are you currently experiencing any auditory, visual or other hallucinations? No  Have You Used Any Alcohol or Drugs in the Past 24 Hours? No  Do you have any current medical co-morbidities that require immediate attention? Yes  Please describe current medical co-morbidities that require immediate attention: Delusional disorder  Clinician description of patient physical appearance/behavior: Patient dressed appropriately for the weather  What Do You Feel Would Help You the Most Today? Medication(s);Stress Management  If access to [facility] was not available, would you have sought care in the Emergency Department? No  Determination of Need Routine (7 days)  Options For Referral Medication Management

## 2021-11-22 NOTE — Discharge Instructions (Addendum)
Take all medications as prescribed. Keep all follow-up appointments as scheduled.  Do not consume alcohol or use illegal drugs while on prescription medications. Report any adverse effects from your medications to your primary care provider promptly.  In the event of recurrent symptoms or worsening symptoms, call 911, a crisis hotline, or go to the nearest emergency department for evaluation.    Consider LAI

## 2021-12-08 ENCOUNTER — Telehealth (HOSPITAL_COMMUNITY): Payer: Self-pay

## 2021-12-08 NOTE — BH Assessment (Signed)
Care Management - [facility] Follow Up Discharges  ° °Writer attempted to make contact with patient today and was unsuccessful.  Writer left a HIPPA compliant voice message.  ° °Per chart review, patient was provided with outpatient resources. ° °
